# Patient Record
Sex: Male | Born: 1955 | Race: Black or African American | Hispanic: No | Marital: Single | Smoking: Never smoker
Health system: Southern US, Community
[De-identification: ages and names within clinical notes are randomized; demographics above are authoritative.]

---

## 2009-06-26 ENCOUNTER — Encounter: Payer: Self-pay | Admitting: Orthopedic Surgery

## 2009-07-11 ENCOUNTER — Encounter: Payer: Self-pay | Admitting: Orthopedic Surgery

## 2009-08-11 ENCOUNTER — Encounter: Payer: Self-pay | Admitting: Orthopedic Surgery

## 2009-09-11 ENCOUNTER — Encounter: Payer: Self-pay | Admitting: Orthopedic Surgery

## 2009-10-11 ENCOUNTER — Encounter: Payer: Self-pay | Admitting: Orthopedic Surgery

## 2009-10-22 ENCOUNTER — Ambulatory Visit: Payer: Self-pay | Admitting: Orthopedic Surgery

## 2017-08-14 ENCOUNTER — Other Ambulatory Visit: Payer: Self-pay

## 2017-08-14 ENCOUNTER — Emergency Department
Admission: EM | Admit: 2017-08-14 | Discharge: 2017-08-14 | Disposition: A | Discharge status: Home / Self Care | Payer: Commercial Managed Care - HMO | Attending: Emergency Medicine | Admitting: Emergency Medicine

## 2017-08-14 ENCOUNTER — Emergency Department: Payer: Commercial Managed Care - HMO

## 2017-08-14 ENCOUNTER — Encounter: Payer: Self-pay | Admitting: Emergency Medicine

## 2017-08-14 DIAGNOSIS — Y92410 Unspecified street and highway as the place of occurrence of the external cause: Secondary | ICD-10-CM | POA: Diagnosis not present

## 2017-08-14 DIAGNOSIS — S29019A Strain of muscle and tendon of unspecified wall of thorax, initial encounter: Secondary | ICD-10-CM | POA: Diagnosis not present

## 2017-08-14 DIAGNOSIS — Y939 Activity, unspecified: Secondary | ICD-10-CM | POA: Diagnosis not present

## 2017-08-14 DIAGNOSIS — S161XXA Strain of muscle, fascia and tendon at neck level, initial encounter: Secondary | ICD-10-CM | POA: Insufficient documentation

## 2017-08-14 DIAGNOSIS — S39012A Strain of muscle, fascia and tendon of lower back, initial encounter: Secondary | ICD-10-CM | POA: Insufficient documentation

## 2017-08-14 DIAGNOSIS — S199XXA Unspecified injury of neck, initial encounter: Secondary | ICD-10-CM | POA: Diagnosis present

## 2017-08-14 DIAGNOSIS — Y998 Other external cause status: Secondary | ICD-10-CM | POA: Insufficient documentation

## 2017-08-14 MED ORDER — METHOCARBAMOL 500 MG PO TABS: 500.0000 mg | ORAL_TABLET | Freq: Four times a day (QID) | ORAL | 0 refills | Status: DC | PRN

## 2017-08-14 MED ORDER — IBUPROFEN 600 MG PO TABS: 600.0000 mg | ORAL_TABLET | Freq: Three times a day (TID) | ORAL | 0 refills | Status: DC | PRN

## 2017-08-14 MED ORDER — KETOROLAC TROMETHAMINE 30 MG/ML IJ SOLN
30.0000 mg | Freq: Once | INTRAMUSCULAR | Status: AC
  Administered 2017-08-14: 30 mg via INTRAMUSCULAR
  Filled 2017-08-14: qty 1
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
## 2017-08-14 NOTE — Discharge Instructions (Addendum)
Follow up with your primary care provider or [facility] acute care if any continued problems.  Begin taking ibuprofen 600 mg 3 times daily with food.  Methocarbamol 500 mg every 6 hours as needed for muscle spasms.  Do not take the muscle relaxant and drive or operate machinery.  You may use moist heat or ice to your muscles as needed for discomfort.
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
## 2018-02-14 ENCOUNTER — Emergency Department: Payer: 59

## 2018-02-14 ENCOUNTER — Emergency Department
Admission: EM | Admit: 2018-02-14 | Discharge: 2018-02-14 | Disposition: A | Discharge status: Home / Self Care | Payer: 59 | Attending: Student in an Organized Health Care Education/Training Program | Admitting: Student in an Organized Health Care Education/Training Program

## 2018-02-14 ENCOUNTER — Encounter: Payer: Self-pay | Admitting: Emergency Medicine

## 2018-02-14 DIAGNOSIS — S53402A Unspecified sprain of left elbow, initial encounter: Secondary | ICD-10-CM | POA: Insufficient documentation

## 2018-02-14 DIAGNOSIS — S76912A Strain of unspecified muscles, fascia and tendons at thigh level, left thigh, initial encounter: Secondary | ICD-10-CM | POA: Diagnosis not present

## 2018-02-14 DIAGNOSIS — T148XXA Other injury of unspecified body region, initial encounter: Secondary | ICD-10-CM

## 2018-02-14 DIAGNOSIS — S59912A Unspecified injury of left forearm, initial encounter: Secondary | ICD-10-CM | POA: Diagnosis present

## 2018-02-14 DIAGNOSIS — S4992XA Unspecified injury of left shoulder and upper arm, initial encounter: Secondary | ICD-10-CM

## 2018-02-14 DIAGNOSIS — S29011A Strain of muscle and tendon of front wall of thorax, initial encounter: Secondary | ICD-10-CM | POA: Insufficient documentation

## 2018-02-14 DIAGNOSIS — Y9389 Activity, other specified: Secondary | ICD-10-CM | POA: Insufficient documentation

## 2018-02-14 DIAGNOSIS — Y92410 Unspecified street and highway as the place of occurrence of the external cause: Secondary | ICD-10-CM | POA: Insufficient documentation

## 2018-02-14 DIAGNOSIS — Y998 Other external cause status: Secondary | ICD-10-CM | POA: Insufficient documentation

## 2018-02-14 MED ORDER — CYCLOBENZAPRINE HCL 5 MG PO TABS: 5.0000 mg | ORAL_TABLET | Freq: Three times a day (TID) | ORAL | 0 refills | Status: DC | PRN

## 2018-02-14 MED ORDER — HYDROCODONE-ACETAMINOPHEN 5-325 MG PO TABS: 1.0000 | ORAL_TABLET | ORAL | 0 refills | Status: DC | PRN

## 2018-02-14 MED ORDER — NAPROXEN 500 MG PO TABS
500.0000 mg | ORAL_TABLET | Freq: Once | ORAL | Status: AC
  Administered 2018-02-14: 500 mg via ORAL

## 2018-02-14 NOTE — Discharge Instructions (Addendum)
Follow up with pcp.  Return for any additional questions or concerns.
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
## 2018-02-14 NOTE — ED Notes (Signed)
AAOx3.  Skin warm and dry.  NAD 
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
## 2020-12-07 ENCOUNTER — Encounter: Payer: Self-pay | Admitting: Family Medicine

## 2020-12-09 ENCOUNTER — Telehealth: Payer: Self-pay

## 2020-12-09 ENCOUNTER — Other Ambulatory Visit: Payer: Self-pay

## 2020-12-09 DIAGNOSIS — Z86010 Personal history of colonic polyps: Secondary | ICD-10-CM

## 2020-12-09 MED ORDER — PEG 3350-KCL-NA BICARB-NACL 420 G PO SOLR: 4000.0000 mL | Freq: Once | ORAL | 0 refills | Status: AC

## 2020-12-09 NOTE — Progress Notes (Signed)
Gastroenterology Pre-Procedure Review  Request Date: 01/19/2021 Requesting Physician: Dr. Allegra Lai  PATIENT REVIEW QUESTIONS: The patient responded to the following health history questions as indicated:    1. Are you having any GI issues? no 2. 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 yes (10) 3. Do you have a family history of Colon Cancer or Polyps? no 4. Diabetes Mellitus? no 5. Joint replacements in the past 12 months?no 6. Major health problems in the past 3 months?no 7. Any artificial heart valves, MVP, or defibrillator?no    MEDICATIONS & ALLERGIES:    Patient reports the following regarding taking any anticoagulation/antiplatelet therapy:   Plavix, Coumadin, Eliquis, Xarelto, Lovenox, Pradaxa, Brilinta, or Effient? no Aspirin? no  Patient confirms/reports the following medications:  Current Outpatient Medications  Medication Sig Dispense Refill   meloxicam (MOBIC) 15 MG tablet meloxicam 15 mg tablet  Take 1 tablet every day by oral route.     No current facility-administered medications for this visit.    Patient confirms/reports the following allergies:  No Known Allergies  No orders of the defined types were placed in this encounter.   AUTHORIZATION INFORMATION Primary Insurance: 1D#: Group #:  Secondary Insurance: 1D#: Group #:  SCHEDULE INFORMATION: Date: 01/19/2021 Time: Location: [facility]

## 2020-12-09 NOTE — Telephone Encounter (Signed)
Scheduled for colonoscopy. 

## 2020-12-18 ENCOUNTER — Ambulatory Visit: Payer: Medicare Other | Admitting: Urology

## 2020-12-18 ENCOUNTER — Encounter: Payer: Self-pay | Admitting: Urology

## 2020-12-18 ENCOUNTER — Other Ambulatory Visit: Payer: Self-pay

## 2020-12-18 VITALS — BP 156/92 | HR 76 | Ht 67.0 in | Wt 180.0 lb

## 2020-12-18 DIAGNOSIS — R972 Elevated prostate specific antigen [PSA]: Secondary | ICD-10-CM | POA: Diagnosis not present

## 2020-12-18 NOTE — Patient Instructions (Addendum)
Prostate Cancer Screening Prostate cancer screening is testing that is done to check for the presence of prostate cancer in men. The prostate gland is a walnut-sized gland that is located below the bladder and in front of the rectum in males. The function of the prostate is to add fluid to semen during ejaculation. Prostate cancer is one of the most common types of cancer in men. Who should have prostate cancer screening? Screening recommendations vary based on age and other risk factors, as well as between the professional organizations who make the recommendations. In general, screening is recommended if: You are age 50 to 70 and have an average risk for prostate cancer. You should talk with your health care provider about your need for screening and how often screening should be done. Because most prostate cancers are slow growing and will not cause death, screening in this age group is generally reserved for men who have a 10- to 15-year life expectancy. You are younger than age 50, and you have these risk factors: Having a father, brother, or uncle who has been diagnosed with prostate cancer. The risk is higher if your family member's cancer occurred at an early age or if you have multiple family members with prostate cancer at an early age. Being a male who is Black or is of Caribbean or sub-Saharan African descent. In general, screening is not recommended if: You are younger than age 40. You are between the ages of 40 and 49 and you have no risk factors. You are 70 years of age or older. At this age, the risks that screening can cause are greater than the benefits that it may provide. If you are at high risk for prostate cancer, your health care provider may recommend that you have screenings more often or that you start screening at a younger age. How is screening for prostate cancer done? The recommended prostate cancer screening test is a blood test called the prostate-specific antigen (PSA)  test. PSA is a protein that is made in the prostate. As you age, your prostate naturally produces more PSA. Abnormally high PSA levels may be caused by: Prostate cancer. An enlarged prostate that is not caused by cancer (benign prostatic hyperplasia, or BPH). This condition is very common in older men. A prostate gland infection (prostatitis) or urinary tract infection. Certain medicines such as male hormones (like testosterone) or other medicines that raise testosterone levels. A rectal exam may be done as part of prostate cancer screening to help provide information about the size of your prostate gland. When a rectal exam is performed, it should be done after the PSA level is drawn to avoid any effect on the results. Depending on the PSA results, you may need more tests, such as: A physical exam to check the size of your prostate gland, if not done as part of screening. Blood and imaging tests. A procedure to remove tissue samples from your prostate gland for testing (biopsy). This is the only way to know for certain if you have prostate cancer. What are the benefits of prostate cancer screening? Screening can help to identify cancer at an early stage, before symptoms start and when the cancer can be treated more easily. There is a small chance that screening may lower your risk of dying from prostate cancer. The chance is small because prostate cancer is a slow-growing cancer, and most men with prostate cancer die from a different cause. What are the risks of prostate cancer screening? The main   risk of prostate cancer screening is diagnosing and treating prostate cancer that would never have caused any symptoms or problems. This is called overdiagnosisand overtreatment. PSA screening cannot tell you if your PSA is high due to cancer or a different cause. A prostate biopsy is the only procedure to diagnose prostate cancer. Even the results of a biopsy may not tell you if your cancer needs to be  treated. Slow-growing prostate cancer may not need any treatment other than monitoring, so diagnosing and treating it may cause unnecessary stress or other side effects. Questions to ask your health care provider When should I start prostate cancer screening? What is my risk for prostate cancer? How often do I need screening? What type of screening tests do I need? How do I get my test results? What do my results mean? Do I need treatment? Where to find more information The American Cancer Society: www.cancer.org American Urological Association: www.auanet.org Contact a health care provider if: You have difficulty urinating. You have pain when you urinate or ejaculate. You have blood in your urine or semen. You have pain in your back or in the area of your prostate. Summary Prostate cancer is a common type of cancer in men. The prostate gland is located below the bladder and in front of the rectum. This gland adds fluid to semen during ejaculation. Prostate cancer screening may identify cancer at an early stage, when the cancer can be treated more easily and is less likely to have spread to other areas of the body. The prostate-specific antigen (PSA) test is the recommended screening test for prostate cancer, but it has associated risks. Discuss the risks and benefits of prostate cancer screening with your health care provider. If you are age 70 or older, the risks that screening can cause are greater than the benefits that it may provide. This information is not intended to replace advice given to you by your health care provider. Make sure you discuss any questions you have with your health care provider. Document Revised: 06/23/2020 Document Reviewed: 06/23/2020 Elsevier Patient Education  2022 [employer] Inc[employer]   Prostate-Specific Antigen Test Why am I having this test? The prostate-specific antigen (PSA) test is a screening test for prostate cancer. It can identify early signs of  prostate cancer, which may allow for early detection and more effective treatment. Your health care provider may recommend that you have a PSA test starting at age 50 or that you have one earlier if you are at higher risk for prostate cancer. You may also have a PSA test: To monitor treatment of prostate cancer. To check whether prostate cancer has returned after treatment. What is being tested? This test measures the amount of PSA in your blood. PSA is a protein that is made in the prostate. The prostate naturally produces more PSA as you age, but very high levels may be a sign of a medical condition. What kind of sample is taken? A blood sample is required for this test. It is usually collected by inserting a needle into a blood vessel but can also be collected by sticking a finger with a small needle. Blood for this test should be drawn before having an exam of the prostate that involves digital rectal examination to avoid affecting the results. How do I prepare for this test? Do not ejaculate starting 24 hours before your test, or as long as told by your health care provider, as this can cause an elevation in PSA. Do not undergo any   procedures that require manipulation of the prostate, such as biopsy or surgery, for 6 weeks before the test is done as this can cause an elevation in PSA. Tell a health care provider about: Any signs you may have of other conditions that can affect PSA levels, such as: An enlarged prostate that is not caused by cancer (benign prostatic hyperplasia, or BPH). This condition is very common in older men. A prostate or urinary tract infection. Any allergies you have. All medicines you are taking, including vitamins, herbs, eye drops, creams, and over-the-counter medicines. This also includes: Medicines to assist with hair growth, such as finasteride. Any recent exposure to a medicine called diethylstilbestrol (DES). Medicines such as male hormones (like testosterone) or  other medicines that raise testosterone levels. Any bleeding problems you have. Any recent procedures you have had, especially any procedures involving the prostate or rectum. Any medical conditions you have. How are the results reported? Your test results will be reported as a value that indicates how much PSA is in your blood. This will be given as nanograms of PSA per milliliter of blood (ng/mL). Your health care provider will compare your results to normal ranges that were established after testing a large group of people (reference ranges). Reference ranges may vary among labs and hospitals. PSA levels vary from person to person and generally increase with age. Because of this variation, there is no single PSA value that is considered normal for everyone. Instead, PSA reference ranges are used to describe whether your PSA levels are considered low or high (elevated). Common reference ranges are: Low: 0-2.5 ng/mL. Slightly to moderately elevated: 2.6-10.0 ng/mL. Moderately elevated: 10.0-19.9 ng/mL. Significantly elevated: 20 ng/mL or greater. What do the results mean? A test result that is higher than 4 ng/mL may mean that you have prostate cancer. However, a PSA test by itself is not enough to diagnose prostate cancer. High PSA levels may also be caused by the natural aging process, prostate infection (prostatitis), or BPH. PSA screening cannot tell you if your PSA is high due to cancer or a different cause. A prostate biopsy is the only way to diagnose prostate cancer. A risk of having the PSA test is diagnosing and treating prostate cancer that would never have caused any symptoms or problems (overdiagnosis and overtreatment). Talk with your health care provider about what your results mean. In some cases, your health care provider may do more testing to confirm the results. Questions to ask your health care provider Ask your health care provider, or the department that is doing the  test: When will my results be ready? How will I get my results? What are my treatment options? What other tests do I need? What are my next steps? Summary The prostate-specific antigen (PSA) test is a screening test for prostate cancer. Your health care provider may recommend that you have a PSA test starting at age 74 or that you have one earlier if you are at higher risk for prostate cancer. A test result that is higher than 4 ng/mL may mean that you have prostate cancer. However, elevated levels can be caused by a number of conditions other than prostate cancer. Talk with your health care provider about what your results mean. This information is not intended to replace advice given to you by your health care provider. Make sure you discuss any questions you have with your health care provider. Document Revised: 05/07/2020 Document Reviewed: 05/07/2020 Elsevier Patient Education  2022 [employer].  Transrectal  Ultrasound-Guided Prostate Biopsy A transrectal ultrasound-guided prostate biopsy is a procedure to remove samples of prostate tissue for testing. The prostate is a walnut-sized gland that is located below the bladder and in front of the rectum. During this procedure, a small device (probe) is lubricated and put inside the rectum. The probe sends out sound waves that make a picture of the prostate and surrounding tissues (transrectal ultrasound). The images are used to help guide the process of removing the samples. The samples are taken to a lab to be checked for prostate cancer. This procedure is usually done to evaluate the prostate gland of men who have raised (elevated) levels of prostate-specific antigen (PSA), which can be a sign of prostate cancer or prostate enlargement related to aging (benign prostatic hyperplasia, or BPH). Tell a health care provider about: Any allergies you have. All medicines you are taking, including vitamins, herbs, eye drops, creams, and  over-the-counter medicines. Any problems you or family members have had with anesthetic medicines. Any bleeding problems you have. Any surgeries you have had. Any medical conditions you have. Any prostate infections you have had. What are the risks? Generally, this is a safe procedure. However, problems may occur, including: Prostate infection. Bleeding from the rectum. Blood in the urine. Allergic reactions to medicines. Damage to surrounding structures such as blood vessels, organs, or muscles. Difficulty passing urine. Nerve damage. This is usually temporary. What happens before the procedure? Medicines Ask your health care provider about: Changing or stopping your regular medicines. This is especially important if you are taking diabetes medicines or blood thinners. Taking medicines such as aspirin and ibuprofen. These medicines can thin your blood. Do not take these medicines unless your health care provider tells you to take them. Taking over-the-counter medicines, vitamins, herbs, and supplements. General instructions Follow instructions from your health care provider about eating and drinking. In most instances, you will not need to stop eating and drinking completely before the procedure. You will be given an enema. During an enema, a liquid is injected into your rectum to clear out waste. You may have a blood or urine sample taken. Ask your health care provider what steps will be taken to help prevent infection. These steps may include: Washing skin with a germ-killing soap. Taking antibiotic medicine. If you will be going home right after the procedure, plan to have a responsible adult: Take you home from the hospital or clinic. You will not be allowed to drive. Care for you for the time you are told. What happens during the procedure?  An IV will be inserted into one of your veins. You will be given one or both of the following: A medicine to help you relax  (sedative). A medicine to numb the area (local anesthetic). You will be placed on your left side, and your knees will be bent toward your chest. A probe with lubricated gel will be placed into your rectum, and images will be taken of your prostate and surrounding structures. Numbing medicine will be injected into your prostate. A biopsy needle will be inserted through your rectum or perineum and guided to your prostate using the ultrasound images. Prostate tissue samples will be removed, and the needle and probe will then be removed. The biopsy samples will be sent to a lab to be tested. The procedure may vary among health care providers and hospitals. What happens after the procedure? Your blood pressure, heart rate, breathing rate, and blood oxygen level will be monitored until you leave  the hospital or clinic. You may have some discomfort in the rectal area. You will be given pain medicine as needed. If you were given a sedative during the procedure, it can affect you for several hours. Do not drive or operate machinery until your health care provider says that it is safe. It is up to you to get the results of your procedure. Ask your health care provider, or the department that is doing the procedure, when your results will be ready. Keep all follow-up visits. This is important. Summary A transrectal ultrasound-guided biopsy removes samples of tissue from your prostate using ultrasound-guided sound waves to help guide the process. This procedure is usually done to evaluate the prostate gland of men who have raised (elevated) levels of prostate-specific antigen (PSA), which can be a sign of prostate cancer or prostate enlargement related to aging. After your procedure, you may feel some discomfort in the rectal area. Plan to have a responsible adult take you home from the hospital or clinic, and follow up with your health care provider for your results. This information is not intended to  replace advice given to you by your health care provider. Make sure you discuss any questions you have with your health care provider. Document Revised: 06/23/2020 Document Reviewed: 06/23/2020 Elsevier Patient Education  2022 [employer].

## 2020-12-18 NOTE — Progress Notes (Signed)
   12/18/20 2:24 PM   Alejandro Kirby Mar 19, 1955 [id number]  CC: Elevated PSA  HPI: I saw Alejandro Kirby today for evaluation of an elevated PSA.  He is a healthy 65 year old male who has not gotten routine medical care, and was recently found to have an elevated PSA of 5.8 on 12/01/2020.  There are no prior PSA values to review.  He reports a family history of nonlethal prostate cancer in his father that was treated with radiation.  He denies any urinary symptoms, or trouble with erections.  He denies any gross hematuria.   Social History:  reports that he has never smoked. He has never used smokeless tobacco. No history on file for alcohol use and drug use.  Physical Exam: BP (!) 156/92 (BP Location: Left Arm, Patient Position: Sitting, Cuff Size: Large)   Pulse 76   Ht 5\' 7"  (1.702 m)   Wt 180 lb (81.6 kg)   BMI 28.19 kg/m    Constitutional:  Alert and oriented, No acute distress. Cardiovascular: No clubbing, cyanosis, or edema. Respiratory: Normal respiratory effort, no increased work of breathing. GI: Abdomen is soft, nontender, nondistended, no abdominal masses DRE: 40 g, smooth, no nodules or masses  Laboratory Data: PSA 5.8  Assessment & Plan:   65 year old male with family history of prostate cancer in his father with single elevated PSA of 5.8.  We reviewed the implications of an elevated PSA and the uncertainty surrounding it. In general, a man's PSA increases with age and is produced by both normal and cancerous prostate tissue. The differential diagnosis for elevated PSA includes BPH, prostate cancer, infection, recent intercourse/ejaculation, recent urethroscopic manipulation (foley placement/cystoscopy) or trauma, and prostatitis.   Management of an elevated PSA can include observation or prostate biopsy and we discussed this in detail. Our goal is to detect clinically significant prostate cancers, and manage with either active surveillance, surgery, or radiation for  localized disease. Risks of prostate biopsy include bleeding, infection (including life threatening sepsis), pain, and lower urinary symptoms. Hematuria, hematospermia, and blood in the stool are all common after biopsy and can persist up to 4 weeks.   I recommended repeat PSA with reflex to free today, will call with results.  If PSA remains elevated recommend biopsy,  may require valium prior to biopsy.  I spent 50 total minutes on the day of the encounter including pre-visit review of the medical record, face-to-face time with the patient, and post visit ordering of labs/imaging/tests.  76, MD 12/18/2020  [facility] [address] [place], [place] [phone]

## 2020-12-19 LAB — FPSA% REFLEX
% FREE PSA: 24.4 %
PSA, FREE: 1.9 ng/mL

## 2020-12-19 LAB — PSA TOTAL (REFLEX TO FREE): Prostate Specific Ag, Serum: 7.8 ng/mL — ABNORMAL HIGH (ref 0.0–4.0)

## 2020-12-22 ENCOUNTER — Telehealth: Payer: Self-pay

## 2020-12-22 NOTE — Telephone Encounter (Signed)
Patient left a message on the triage line requesting lab results, please advise

## 2020-12-23 ENCOUNTER — Other Ambulatory Visit: Payer: Self-pay | Admitting: Urology

## 2020-12-23 MED ORDER — DIAZEPAM 10 MG PO TABS: 10.0000 mg | ORAL_TABLET | Freq: Once | ORAL | 0 refills | Status: AC | PRN

## 2020-12-24 ENCOUNTER — Telehealth: Payer: Self-pay

## 2020-12-24 NOTE — Telephone Encounter (Signed)
-----   Message from Sondra Come, MD sent at 12/23/2020  8:14 AM EST ----- PSA continues to be elevated, strongly recommend prostate biopsy, especially with his history of prostate cancer in his father.  I will send a Valium prior for him, but he will need a driver.  Please review biopsy instructions and schedule prostate biopsy  Legrand Rams, MD 12/23/2020

## 2020-12-24 NOTE — Telephone Encounter (Signed)
Called pt informed him of the information below. Pt gave verbal understanding, appointment scheduled. Instructions reviewed and mailed.

## 2020-12-24 NOTE — Telephone Encounter (Signed)
Patient lvm wanting lab results.

## 2020-12-31 NOTE — Telephone Encounter (Signed)
Previously addressed see prior telephone encounter

## 2021-01-14 ENCOUNTER — Other Ambulatory Visit: Payer: Medicare Other | Admitting: Urology

## 2021-01-19 ENCOUNTER — Ambulatory Visit: Admission: RE | Admit: 2021-01-19 | Payer: Medicare Other | Source: Ambulatory Visit | Admitting: Gastroenterology

## 2021-01-19 ENCOUNTER — Encounter: Admission: RE | Payer: Self-pay | Source: Ambulatory Visit

## 2021-01-19 SURGERY — COLONOSCOPY WITH PROPOFOL
Anesthesia: General

## 2021-01-22 ENCOUNTER — Other Ambulatory Visit: Payer: Medicare Other | Admitting: Urology

## 2021-01-22 ENCOUNTER — Ambulatory Visit: Payer: Medicare Other | Admitting: Urology

## 2021-01-22 ENCOUNTER — Encounter: Payer: Self-pay | Admitting: Urology

## 2021-01-28 ENCOUNTER — Ambulatory Visit: Payer: Medicare Other | Admitting: Urology

## 2021-03-09 ENCOUNTER — Ambulatory Visit
Admission: RE | Admit: 2021-03-09 | Discharge: 2021-03-09 | Disposition: A | Discharge status: Home / Self Care | Payer: Medicare Other | Source: Ambulatory Visit | Attending: Family Medicine | Admitting: Family Medicine

## 2021-03-09 ENCOUNTER — Other Ambulatory Visit: Payer: Self-pay | Admitting: Family Medicine

## 2021-03-09 ENCOUNTER — Other Ambulatory Visit: Payer: Self-pay

## 2021-03-09 DIAGNOSIS — R1031 Right lower quadrant pain: Secondary | ICD-10-CM

## 2021-03-09 LAB — POCT I-STAT CREATININE: Creatinine, Ser: 1.1 mg/dL (ref 0.61–1.24)

## 2021-03-09 MED ORDER — IOHEXOL 300 MG/ML  SOLN
100.0000 mL | Freq: Once | INTRAMUSCULAR | Status: AC | PRN
  Administered 2021-03-09: 100 mL via INTRAVENOUS

## 2023-06-15 ENCOUNTER — Ambulatory Visit: Admitting: Urology
